# Patient Record
Sex: Male | Born: 1990 | Race: White | Hispanic: No | Marital: Single | Smoking: Current some day smoker
Health system: Southern US, Community
[De-identification: ages and names within clinical notes are randomized; demographics above are authoritative.]

## PROBLEM LIST (undated history)

## (undated) DIAGNOSIS — F909 Attention-deficit hyperactivity disorder, unspecified type: Secondary | ICD-10-CM

## (undated) HISTORY — DX: Attention-deficit hyperactivity disorder, unspecified type: F90.9

---
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
## 2008-05-22 IMAGING — CT CT HEAD W/O CM
3 of 8 series · 15 of 47 positions shown, 18 images · IV contrast (agent unspecified)
Comparison: None

CLINICAL DATA: Hit in head with a baseball. Left face and neck pain
TECHNIQUE: 5mm collimated images were obtained from the base of the skull
through the vertex according to standard protocol without contrast.

HEAD CT WITHOUT CONTRAST:
TECHNIQUE: Multidetector CT imaging of the cervical spine was performed. 
Sagittal and coronal plane reformatted images were reconstructed from the axial
CT data, and were also reviewed.
TECHNIQUE: Axial and coronal plane CT imaging of the maxillofacial structures
was performed including the facial bones, paranasal sinuses, and orbits.  No
intravenous contrast was administered.

[Series 9: orbit 1.0 h30s · axial · 0.31mm/px · z∈[-231,-88]mm · 9 of 169 slices shown, 12 images]
[im 13/169  brain]
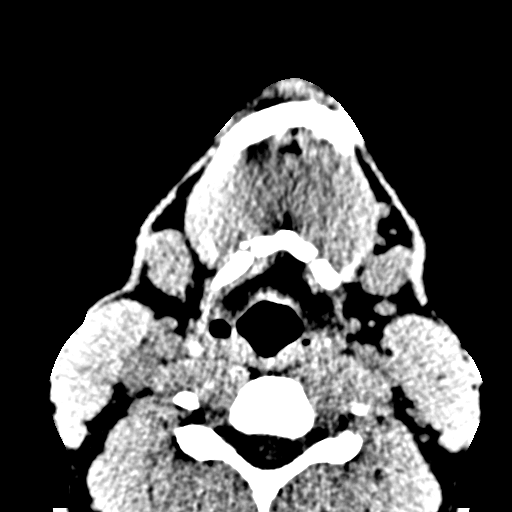
[im 13/169  bone]
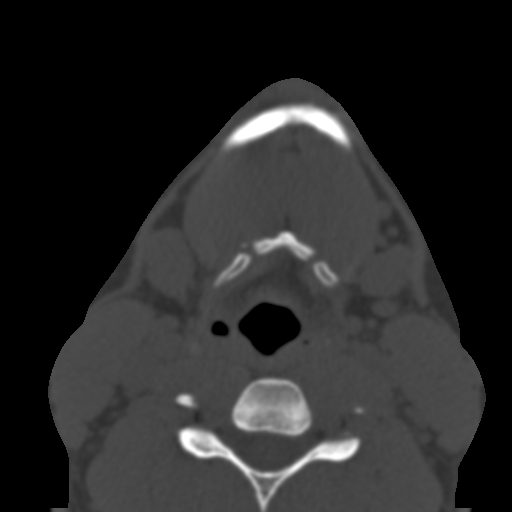
[im 39/169  brain]
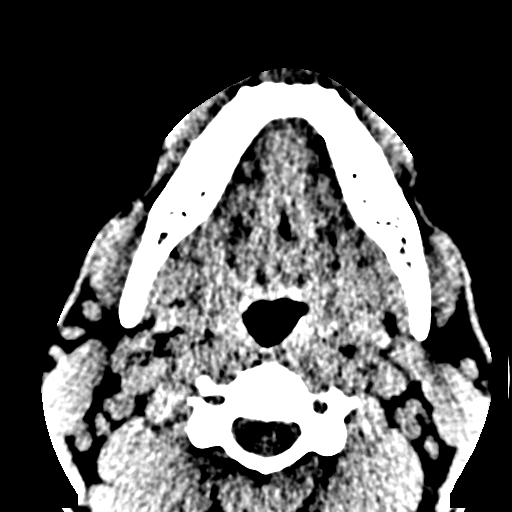
[im 52/169  brain]
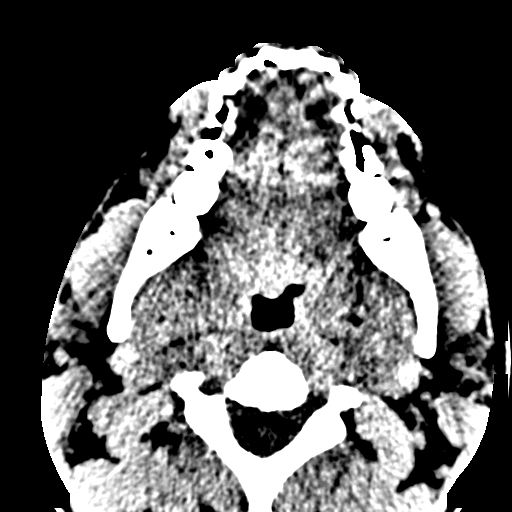
[im 65/169  brain]
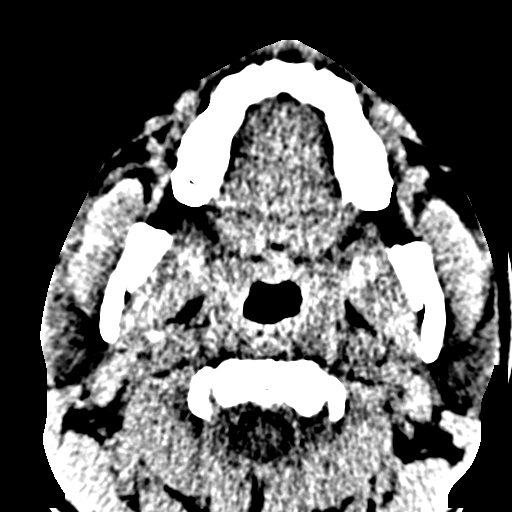
[im 91/169  brain]
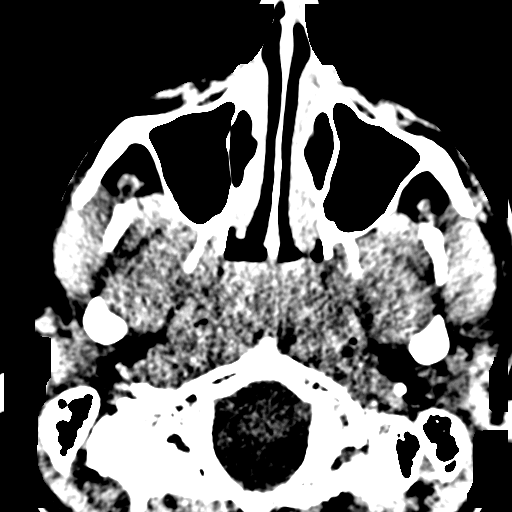
[im 91/169  bone]
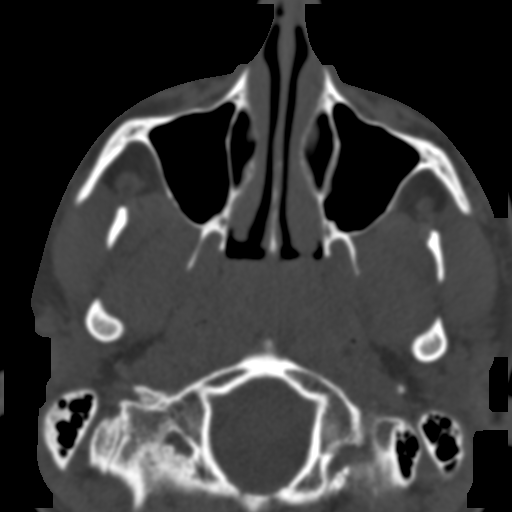
[im 104/169  brain]
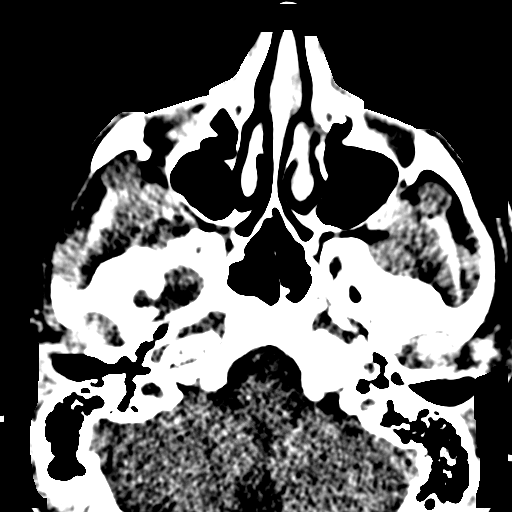
[im 117/169  brain]
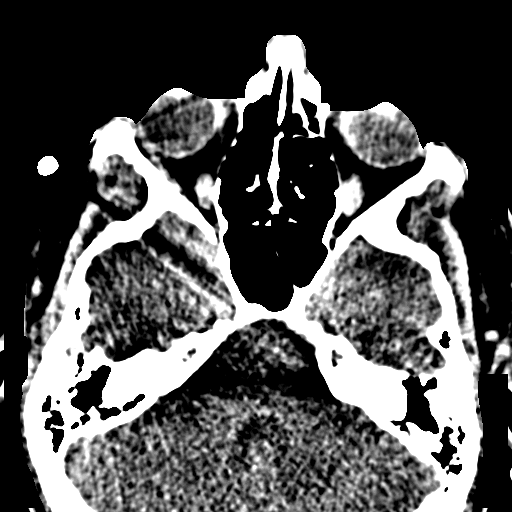
[im 143/169  brain]
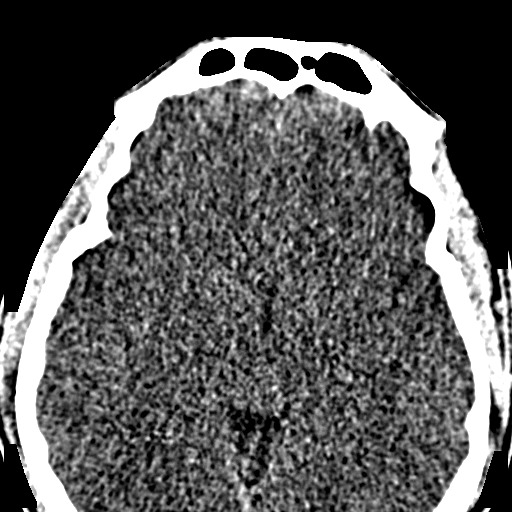
[im 156/169  brain]
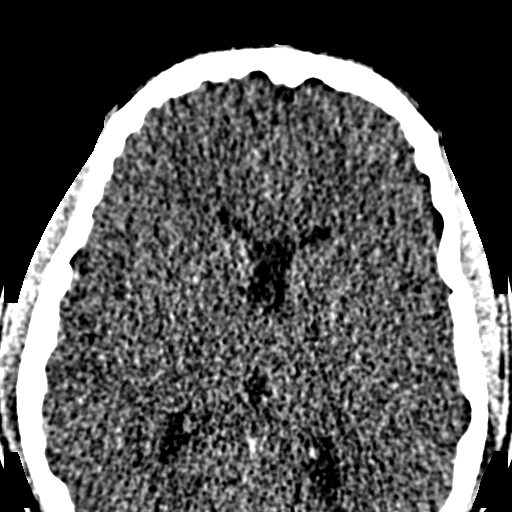
[im 156/169  bone]
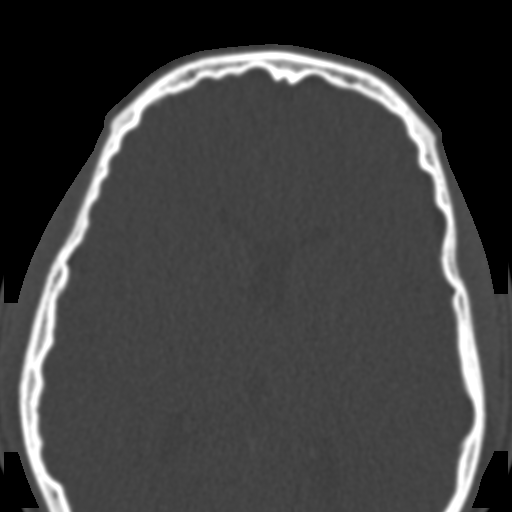

[Series 605: <mpr range(2)>coronal orbit soft ti · coronal · 0.33mm/px · 3 of 48 slices shown]
[im 12/48  brain]
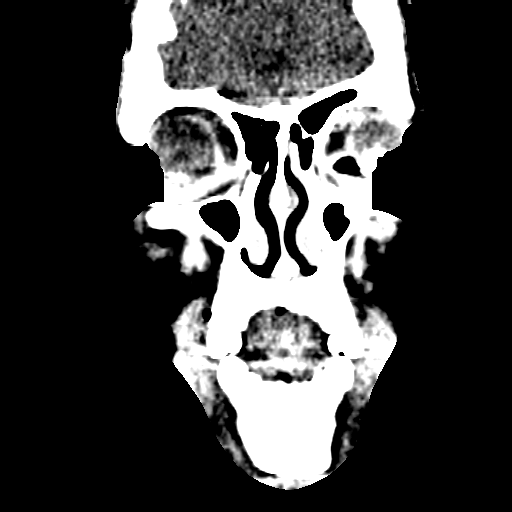
[im 24/48  brain]
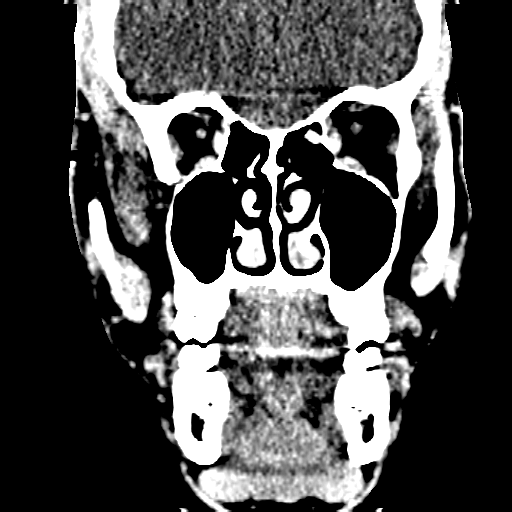
[im 36/48  brain]
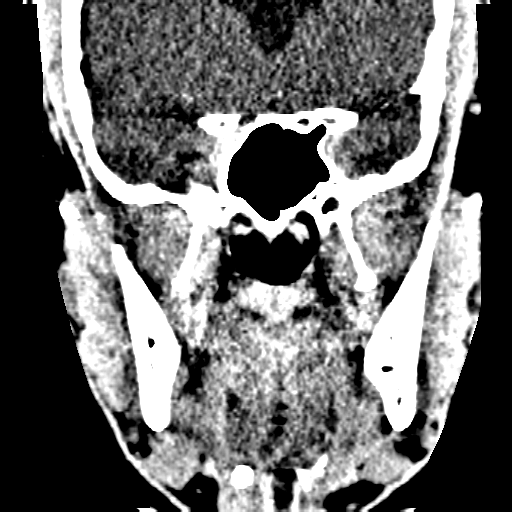

[Series 606: <mpr range(3)saggital · sagittal · 0.33mm/px · 3 of 69 slices shown]
[im 18/69  brain]
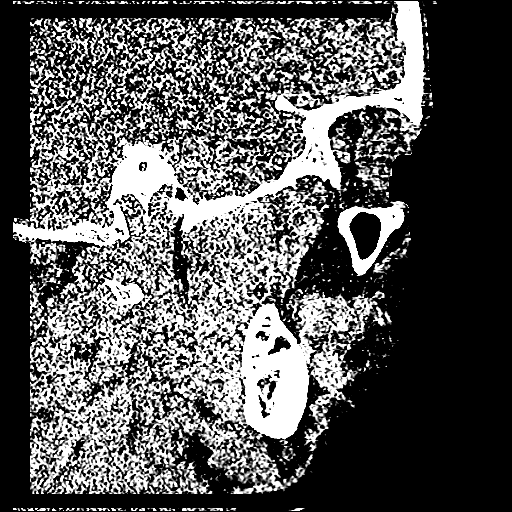
[im 35/69  brain]
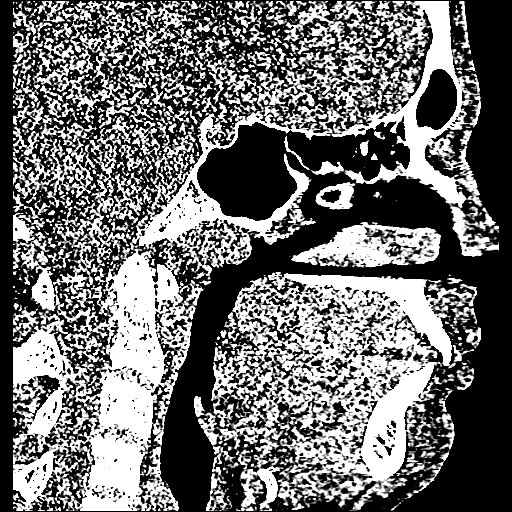
[im 52/69  brain]
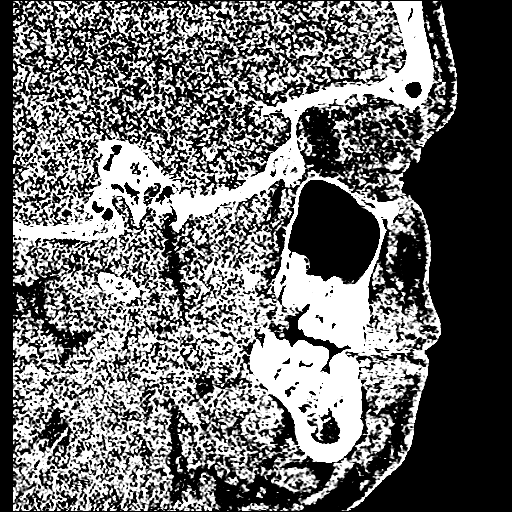

[15 of 47 positions shown; findings below may reference images not displayed]

FINDINGS: There is no evidence for acute hemorrhage, hydrocephalus,
mass/mass-effect or abnormal extra-axial fluid collection.  No definite CT
evidence for acute ischemia. Bone windows show no evidence for skull fracture.
The visualized paranasal sinuses are clear.
IMPRESSION: No acute intracranial abnormality. 

CERVICAL SPINE CT WITHOUT CONTRAST:
FINDINGS: Imaging was obtained from the skullbase to the T-[DATE] interspace. There
is straightening of the normal cervical lordosis. No evidence for acute fracture
or subluxation. Intervertebral disc spaces are preserved throughout. Facets are
well aligned bilaterally. There is no evidence for prevertebral soft tissue
swelling.
IMPRESSION: No evidence for acute fracture or subluxation. No static signs of instability.

Loss of normal cervical lordosis. This may be related to patient positioning,
muscle spasm, or soft tissue injury.

MAXILLOFACIAL CT WITHOUT CONTRAST:
FINDINGS: There is no evidence for an acute facial bone fracture. Specifically,
the mandible, zygomatic arches, nasal bones, paranasal sinuses, and temporal
bones are intact. The inferior and medial orbital walls are intact bilaterally.
The temporomandibular joints are located. No fluid is identified in the mastoid
air cells or paranasal sinuses to suggest hemorrhage.
IMPRESSION: No acute facial bone fracture.

## 2018-01-30 DIAGNOSIS — M545 Low back pain: Secondary | ICD-10-CM | POA: Diagnosis not present

## 2018-01-30 DIAGNOSIS — R509 Fever, unspecified: Secondary | ICD-10-CM | POA: Diagnosis not present

## 2018-03-15 DIAGNOSIS — F909 Attention-deficit hyperactivity disorder, unspecified type: Secondary | ICD-10-CM | POA: Diagnosis not present

## 2018-03-15 DIAGNOSIS — Z79899 Other long term (current) drug therapy: Secondary | ICD-10-CM | POA: Diagnosis not present

## 2018-04-18 DIAGNOSIS — Z79899 Other long term (current) drug therapy: Secondary | ICD-10-CM | POA: Diagnosis not present

## 2018-04-18 DIAGNOSIS — R946 Abnormal results of thyroid function studies: Secondary | ICD-10-CM | POA: Diagnosis not present

## 2018-04-18 DIAGNOSIS — F909 Attention-deficit hyperactivity disorder, unspecified type: Secondary | ICD-10-CM | POA: Diagnosis not present

## 2018-07-10 DIAGNOSIS — R945 Abnormal results of liver function studies: Secondary | ICD-10-CM | POA: Diagnosis not present

## 2018-07-10 DIAGNOSIS — Z6841 Body Mass Index (BMI) 40.0 and over, adult: Secondary | ICD-10-CM | POA: Diagnosis not present

## 2018-10-10 DIAGNOSIS — H66001 Acute suppurative otitis media without spontaneous rupture of ear drum, right ear: Secondary | ICD-10-CM | POA: Diagnosis not present

## 2020-11-30 DIAGNOSIS — H66006 Acute suppurative otitis media without spontaneous rupture of ear drum, recurrent, bilateral: Secondary | ICD-10-CM | POA: Diagnosis not present

## 2022-07-03 ENCOUNTER — Encounter (HOSPITAL_BASED_OUTPATIENT_CLINIC_OR_DEPARTMENT_OTHER): Payer: Self-pay | Admitting: Emergency Medicine

## 2022-07-03 ENCOUNTER — Other Ambulatory Visit: Payer: Self-pay

## 2022-07-03 DIAGNOSIS — L03115 Cellulitis of right lower limb: Secondary | ICD-10-CM | POA: Insufficient documentation

## 2022-07-03 NOTE — ED Triage Notes (Signed)
Right leg area redness, tender  Marked by patient Started Sunday morning.

## 2022-07-04 ENCOUNTER — Emergency Department (HOSPITAL_BASED_OUTPATIENT_CLINIC_OR_DEPARTMENT_OTHER)
Admission: EM | Admit: 2022-07-04 | Discharge: 2022-07-04 | Disposition: A | Discharge status: Home / Self Care | Payer: Self-pay | Attending: Emergency Medicine | Admitting: Emergency Medicine

## 2022-07-04 DIAGNOSIS — L03115 Cellulitis of right lower limb: Secondary | ICD-10-CM

## 2022-07-04 MED ORDER — DOXYCYCLINE HYCLATE 100 MG PO CAPS: 100.0000 mg | ORAL_CAPSULE | Freq: Two times a day (BID) | ORAL | 0 refills | Status: DC

## 2022-07-04 MED ORDER — DOXYCYCLINE HYCLATE 100 MG PO TABS
100.0000 mg | ORAL_TABLET | Freq: Once | ORAL | Status: AC
  Administered 2022-07-04: 100 mg via ORAL
  Filled 2022-07-04: qty 1

## 2022-07-04 NOTE — ED Provider Notes (Signed)
   [facility] EMERGENCY DEPT  Provider Note  CSN: [id number] Arrival date & time: 07/03/22 2303  History Chief Complaint  Patient presents with   Leg Pain    Brendan Peterson is a 31 y.o. male with no PMH reports increased pain, redness and swelling to his R lower leg for the last 24-36 hours. He marked the edge of the redness around 5pm and noticed a few hours later it had spread. No fevers. No known injury.    Home Medications Prior to Admission medications   Medication Sig Start Date End Date Taking? Authorizing Provider  doxycycline (VIBRAMYCIN) 100 MG capsule Take 1 capsule (100 mg total) by mouth 2 (two) times daily. 07/04/22  Yes Pollyann Savoy, MD     Allergies    Patient has no known allergies.   Review of Systems   Review of Systems Please see HPI for pertinent positives and negatives  Physical Exam BP 136/76 (BP Location: Right Arm)   Pulse 82   Temp 98.4 F (36.9 C)   Resp 19   SpO2 98%   Physical Exam Vitals and nursing note reviewed.  HENT:     Head: Normocephalic.     Nose: Nose normal.  Eyes:     Extraocular Movements: Extraocular movements intact.  Pulmonary:     Effort: Pulmonary effort is normal.  Musculoskeletal:        General: Normal range of motion.     Cervical back: Neck supple.     Comments: Erythema, warmth and induration to R lower leg, extending from ankle to mid-shin. No fluctuance  Skin:    Findings: No rash (on exposed skin).  Neurological:     Mental Status: He is alert and oriented to person, place, and time.  Psychiatric:        Mood and Affect: Mood normal.     ED Results / Procedures / Treatments   EKG None  Procedures Procedures  Medications Ordered in the ED Medications  doxycycline (VIBRA-TABS) tablet 100 mg (has no administration in time range)    Initial Impression and Plan  Patient here with cellulitis of RLE. No fluctuance. No crepitus to suggest necrotic infection. Will begin Abx,  erythema margin marked. Recommend 48 follow up for wound check. RTED sooner for any worsening pain, swelling, fever or other concerns.   ED Course       MDM Rules/Calculators/A&P Medical Decision Making Problems Addressed: Cellulitis of right lower extremity: acute illness or injury  Risk Prescription drug management.    Final Clinical Impression(s) / ED Diagnoses Final diagnoses:  Cellulitis of right lower extremity    Rx / DC Orders ED Discharge Orders          Ordered    doxycycline (VIBRAMYCIN) 100 MG capsule  2 times daily        07/04/22 0221             Pollyann Savoy, MD 07/04/22 0221

## 2024-04-20 NOTE — Patient Instructions (Incomplete)

## 2024-04-20 NOTE — Progress Notes (Unsigned)
 New Patient Visit  Subjective:     Patient ID: Brendan Peterson, male    DOB: [birth year], 33 y.o.   MRN: [id number]  No chief complaint on file.   HPI  Discussed the use of AI scribe software for clinical note transcription with the patient, who gave verbal consent to proceed.  History of Present Illness Brendan Peterson is a 33 year old male who presents for evaluation and management of hyperglycemia.  Hyperglycemia - Elevated blood glucose levels up to 360 mg/dL - Blood glucose decreased to 130-140 mg/dL after a five-mile hike and taking glyburide - Blood glucose increased to the upper 200s the following day despite dietary changes - Not currently taking blood glucose medication - Fasted prior to the visit  Headache - Headaches occur once or twice per week - Managed with ibuprofen - Attributed to work stress and dietary habits  Snoring and weight loss - Occasional snoring - Significant weight loss occurred five years ago after the birth of his son, which improved snoring - No history of sleep study  Tobacco and alcohol use - Current tobacco use with pouches - Attempting to quit tobacco use, motivated by young children - Alcohol consumption approximately once per month  Attention deficit hyperactivity disorder (adhd) symptoms - Diagnosed with ADHD in sixth grade - Intermittent use of Concerta after college - Considering resuming medication for work, but no urgent need     ROS Per HPI  Outpatient Encounter Medications as of 04/21/2024  Medication Sig   metFORMIN  (GLUCOPHAGE ) 500 MG tablet Take 1 tablet (500 mg total) by mouth 2 (two) times daily with a meal.   [DISCONTINUED] doxycycline  (VIBRAMYCIN ) 100 MG capsule Take 1 capsule (100 mg total) by mouth 2 (two) times daily. (Patient not taking: Reported on 04/21/2024)   No facility-administered encounter medications on file as of 04/21/2024.    Past Medical History:  Diagnosis Date   ADHD     History reviewed. No  pertinent surgical history.  History reviewed. No pertinent family history.  Social History   Socioeconomic History   Marital status: Single    Spouse name: Not on file   Number of children: Not on file   Years of education: Not on file   Highest education level: Not on file  Occupational History   Not on file  Tobacco Use   Smoking status: Some Days    Types: Cigarettes   Smokeless tobacco: Never  Substance and Sexual Activity   Alcohol use: Not on file   Drug use: Not on file   Sexual activity: Not on file  Other Topics Concern   Not on file  Social History Narrative   Not on file   Social Drivers of Health   Financial Resource Strain: Not on file  Food Insecurity: Not on file  Transportation Needs: Not on file  Physical Activity: Not on file  Stress: Not on file  Social Connections: Not on file  Intimate Partner Violence: Not on file       Objective:    BP 128/80 (BP Location: Left Arm, Patient Position: Sitting, Cuff Size: Large)   Pulse 80   Temp 98.7 F (37.1 C) (Temporal)   Ht 5' 11 (1.803 m)   Wt (!) 349 lb (158.3 kg)   SpO2 93%   BMI 48.68 kg/m    Physical Exam Vitals and nursing note reviewed.  Constitutional:      General: He is not in acute distress.    Appearance:  Normal appearance. He is obese.  HENT:     Head: Normocephalic and atraumatic.     Right Ear: External ear normal.     Left Ear: External ear normal.     Nose: Nose normal.     Mouth/Throat:     Mouth: Mucous membranes are moist.     Pharynx: Oropharynx is clear.  Eyes:     Extraocular Movements: Extraocular movements intact.  Cardiovascular:     Rate and Rhythm: Normal rate and regular rhythm.     Pulses: Normal pulses.     Heart sounds: Normal heart sounds.  Pulmonary:     Effort: Pulmonary effort is normal. No respiratory distress.     Breath sounds: Normal breath sounds. No wheezing, rhonchi or rales.  Musculoskeletal:        General: Normal range of motion.      Cervical back: Normal range of motion.     Right lower leg: No edema.     Left lower leg: No edema.  Lymphadenopathy:     Cervical: No cervical adenopathy.  Skin:    General: Skin is warm and dry.  Neurological:     General: No focal deficit present.     Mental Status: He is alert and oriented to person, place, and time.  Psychiatric:        Mood and Affect: Mood normal.        Behavior: Behavior normal.     No results found for any visits on 04/21/24.      Assessment & Plan:   Assessment and Plan Assessment & Plan Hyperglycemia Elevated blood glucose levels in the 300s. No prior glycemic management. Discussed sleep apnea's impact on glycemic control. - Order labs: liver function, renal function, glucose, sodium, potassium, CBC, thyroid function, lipid profile. - Initiate metformin  500 mg twice daily with meals, starting with dinner and bedtime. - Educate on metformin  side effects, including diarrhea. - Advise daily fasting blood glucose monitoring. - Provide dietary guidance: low carbohydrates, low sugars, higher protein, higher fat. - Refer to American Diabetes Association and Glucose [employer]dess for dietary information.  Morbid Obesity, BMI 48 Current weight gain. Discussed possible sleep apnea's impact on weight management. - Encourage physical activity: walking and biking. - Consider addressing sleep apnea if weight does not improve.  Snoring Possible obstructive sleep apnea Reports of snoring and improved symptoms with weight loss. No prior sleep study. - Consider sleep study if symptoms persist or do not improve with weight management and glycemic control.  Elevated BP Reading w/o HTN Slightly elevated blood pressure. Family history of cardiovascular events. - Reassess blood pressure management in future visits.  Nicotine dependence, smokeless tobacco Using smokeless tobacco pouches, motivated to quit. - Encourage cessation efforts. - Support use of Zins as a  cessation step.  Attention-deficit hyperactivity disorder, predominantly inattentive type Diagnosed in sixth grade, previously on Concerta. Discussed stimulant use risks with elevated blood pressure. - Prioritize glycemic control. - Reevaluate ADHD medication need in future visits.  Immunization due Discussed flu vaccination. - Administer flu shot during visit.  Encounter screening for cardiovascular disorders - Lipids today     Orders Placed This Encounter  Procedures   Flu vaccine trivalent PF, 6mos and older(Flulaval,Afluria,Fluarix,Fluzone)   CBC with Differential/Platelet    Release to patient:   Immediate [1]   Comprehensive metabolic panel with GFR    Release to patient:   Immediate [1]   Hemoglobin A1c   Lipid panel   Microalbumin / creatinine urine ratio  Release to patient:   Immediate   TSH   Vitamin B12   VITAMIN D  25 Hydroxy (Vit-D Deficiency, Fractures)     Meds ordered this encounter  Medications   metFORMIN  (GLUCOPHAGE ) 500 MG tablet    Sig: Take 1 tablet (500 mg total) by mouth 2 (two) times daily with a meal.    Dispense:  60 tablet    Refill:  1    Return in about 4 weeks (around 05/19/2024) for blood sugars, meds.  Corean LITTIE Ku, FNP

## 2024-04-21 ENCOUNTER — Ambulatory Visit (INDEPENDENT_AMBULATORY_CARE_PROVIDER_SITE_OTHER): Payer: Self-pay | Admitting: Family Medicine

## 2024-04-21 ENCOUNTER — Encounter: Payer: Self-pay | Admitting: Family Medicine

## 2024-04-21 VITALS — BP 128/80 | HR 80 | Temp 98.7°F | Ht 71.0 in | Wt 349.0 lb

## 2024-04-21 DIAGNOSIS — Z23 Encounter for immunization: Secondary | ICD-10-CM | POA: Diagnosis not present

## 2024-04-21 DIAGNOSIS — Z3009 Encounter for other general counseling and advice on contraception: Secondary | ICD-10-CM

## 2024-04-21 DIAGNOSIS — I10 Essential (primary) hypertension: Secondary | ICD-10-CM | POA: Diagnosis not present

## 2024-04-21 DIAGNOSIS — Z720 Tobacco use: Secondary | ICD-10-CM | POA: Diagnosis not present

## 2024-04-21 DIAGNOSIS — R739 Hyperglycemia, unspecified: Secondary | ICD-10-CM | POA: Insufficient documentation

## 2024-04-21 DIAGNOSIS — Z136 Encounter for screening for cardiovascular disorders: Secondary | ICD-10-CM | POA: Diagnosis not present

## 2024-04-21 DIAGNOSIS — Z6841 Body Mass Index (BMI) 40.0 and over, adult: Secondary | ICD-10-CM | POA: Insufficient documentation

## 2024-04-21 DIAGNOSIS — F902 Attention-deficit hyperactivity disorder, combined type: Secondary | ICD-10-CM | POA: Insufficient documentation

## 2024-04-21 DIAGNOSIS — R0683 Snoring: Secondary | ICD-10-CM | POA: Diagnosis not present

## 2024-04-21 LAB — MICROALBUMIN / CREATININE URINE RATIO
Creatinine,U: 181.8 mg/dL
Microalb Creat Ratio: 60.8 mg/g — ABNORMAL HIGH (ref 0.0–30.0)
Microalb, Ur: 11.1 mg/dL — ABNORMAL HIGH (ref 0.0–1.9)

## 2024-04-21 LAB — CBC WITH DIFFERENTIAL/PLATELET
Basophils Absolute: 0 K/uL (ref 0.0–0.1)
Basophils Relative: 0.1 % (ref 0.0–3.0)
Eosinophils Absolute: 0.3 K/uL (ref 0.0–0.7)
Eosinophils Relative: 2 % (ref 0.0–5.0)
HCT: 47.7 % (ref 39.0–52.0)
Hemoglobin: 15.8 g/dL (ref 13.0–17.0)
Lymphocytes Relative: 8.5 % — ABNORMAL LOW (ref 12.0–46.0)
Lymphs Abs: 1.1 K/uL (ref 0.7–4.0)
MCHC: 33.2 g/dL (ref 30.0–36.0)
MCV: 82.8 fl (ref 78.0–100.0)
Monocytes Absolute: 0.8 K/uL (ref 0.1–1.0)
Monocytes Relative: 6.2 % (ref 3.0–12.0)
Neutro Abs: 10.8 K/uL — ABNORMAL HIGH (ref 1.4–7.7)
Neutrophils Relative %: 83.2 % — ABNORMAL HIGH (ref 43.0–77.0)
Platelets: 215 K/uL (ref 150.0–400.0)
RBC: 5.75 Mil/uL (ref 4.22–5.81)
RDW: 13.3 % (ref 11.5–15.5)
WBC: 13 K/uL — ABNORMAL HIGH (ref 4.0–10.5)

## 2024-04-21 LAB — VITAMIN B12: Vitamin B-12: 338 pg/mL (ref 211–911)

## 2024-04-21 LAB — COMPREHENSIVE METABOLIC PANEL WITH GFR
ALT: 44 U/L (ref 0–53)
AST: 24 U/L (ref 0–37)
Albumin: 4.6 g/dL (ref 3.5–5.2)
Alkaline Phosphatase: 67 U/L (ref 39–117)
BUN: 17 mg/dL (ref 6–23)
CO2: 27 meq/L (ref 19–32)
Calcium: 9.5 mg/dL (ref 8.4–10.5)
Chloride: 99 meq/L (ref 96–112)
Creatinine, Ser: 0.94 mg/dL (ref 0.40–1.50)
GFR: 105.4 mL/min (ref >60.00)
Glucose, Bld: 238 mg/dL — ABNORMAL HIGH (ref 70–99)
Potassium: 4.5 meq/L (ref 3.5–5.1)
Sodium: 134 meq/L — ABNORMAL LOW (ref 135–145)
Total Bilirubin: 0.5 mg/dL (ref 0.2–1.2)
Total Protein: 7 g/dL (ref 6.0–8.3)

## 2024-04-21 LAB — LIPID PANEL
Cholesterol: 173 mg/dL (ref 0–200)
HDL: 45.3 mg/dL (ref >39.00)
LDL Cholesterol: 103 mg/dL — ABNORMAL HIGH (ref 0–99)
NonHDL: 128.1
Total CHOL/HDL Ratio: 4
Triglycerides: 125 mg/dL (ref 0.0–149.0)
VLDL: 25 mg/dL (ref 0.0–40.0)

## 2024-04-21 LAB — HEMOGLOBIN A1C: Hgb A1c MFr Bld: 9.3 % — ABNORMAL HIGH (ref 4.6–6.5)

## 2024-04-21 LAB — TSH: TSH: 1.89 u[IU]/mL (ref 0.35–5.50)

## 2024-04-21 LAB — VITAMIN D 25 HYDROXY (VIT D DEFICIENCY, FRACTURES): VITD: 21.32 ng/mL — ABNORMAL LOW (ref 30.00–100.00)

## 2024-04-21 MED ORDER — METFORMIN HCL 500 MG PO TABS: 500.0000 mg | ORAL_TABLET | Freq: Two times a day (BID) | ORAL | 1 refills | Status: DC

## 2024-04-21 NOTE — Telephone Encounter (Signed)
 As you know, we did not see this patient today.  Someone else did.

## 2024-04-25 ENCOUNTER — Ambulatory Visit: Payer: Self-pay | Admitting: Family Medicine

## 2024-04-25 DIAGNOSIS — E559 Vitamin D deficiency, unspecified: Secondary | ICD-10-CM

## 2024-04-25 MED ORDER — VITAMIN D (ERGOCALCIFEROL) 1.25 MG (50000 UNIT) PO CAPS: 50000.0000 [IU] | ORAL_CAPSULE | ORAL | 0 refills | Status: DC

## 2024-05-13 ENCOUNTER — Other Ambulatory Visit: Payer: Self-pay | Admitting: Family Medicine

## 2024-05-13 DIAGNOSIS — Z6841 Body Mass Index (BMI) 40.0 and over, adult: Secondary | ICD-10-CM

## 2024-05-13 DIAGNOSIS — R739 Hyperglycemia, unspecified: Secondary | ICD-10-CM

## 2024-05-14 ENCOUNTER — Encounter: Payer: Self-pay | Admitting: Family Medicine

## 2024-05-14 ENCOUNTER — Telehealth: Admitting: Family Medicine

## 2024-05-14 DIAGNOSIS — J069 Acute upper respiratory infection, unspecified: Secondary | ICD-10-CM | POA: Diagnosis not present

## 2024-05-14 DIAGNOSIS — H6506 Acute serous otitis media, recurrent, bilateral: Secondary | ICD-10-CM | POA: Diagnosis not present

## 2024-05-14 MED ORDER — PSEUDOEPH-BROMPHEN-DM 30-2-10 MG/5ML PO SYRP: 5.0000 mL | ORAL_SOLUTION | Freq: Four times a day (QID) | ORAL | 0 refills | Status: DC | PRN

## 2024-05-14 MED ORDER — NEOMYCIN-POLYMYXIN-HC 3.5-10000-1 OT SOLN: 3.0000 [drp] | Freq: Four times a day (QID) | OTIC | 0 refills | Status: DC

## 2024-05-14 MED ORDER — FLUTICASONE PROPIONATE 50 MCG/ACT NA SUSP: 2.0000 | Freq: Every day | NASAL | 0 refills | Status: DC

## 2024-05-14 MED ORDER — OFLOXACIN 0.3 % OT SOLN: 10.0000 [drp] | Freq: Every day | OTIC | 0 refills | Status: AC

## 2024-05-14 NOTE — Progress Notes (Signed)

## 2024-05-14 NOTE — Addendum Note (Signed)
 Addended byBETHA KINGSTON ROBES on: 05/14/2024 10:32 AM   Modules accepted: Orders

## 2024-05-14 NOTE — Addendum Note (Signed)
 Addended by: VIVIENNE DELON HERO on: 05/14/2024 09:45 AM   Modules accepted: Orders

## 2024-05-14 NOTE — Progress Notes (Signed)
 Ear drops are costly, I will send a different kind.

## 2024-05-19 ENCOUNTER — Telehealth: Admitting: Family Medicine

## 2024-05-19 ENCOUNTER — Ambulatory Visit: Admitting: Family Medicine

## 2024-05-19 DIAGNOSIS — H6506 Acute serous otitis media, recurrent, bilateral: Secondary | ICD-10-CM

## 2024-05-19 NOTE — Progress Notes (Signed)
  Because Mr. Brendan Peterson, I feel your condition warrants further evaluation and I recommend that you be seen in a face-to-face visit.  You need to be examined in person.    NOTE: There will be NO CHARGE for this E-Visit   If you are having a true medical emergency, please call 911.     For an urgent face to face visit, [facility] has multiple urgent care centers for your convenience.  Click the link below for the full list of locations and hours, walk-in wait times, appointment scheduling options and driving directions:  Urgent Care - [place], [place], [place], [place], [place], [place]       Your MyChart E-visit questionnaire answers were reviewed by a board certified advanced clinical practitioner to complete your personal care plan based on your specific symptoms.    Thank you for using e-Visits.

## 2024-05-22 NOTE — Progress Notes (Signed)
 Established Patient Office Visit  Subjective:     Patient ID: Brendan Peterson, male    DOB: 1990-08-23, 33 y.o.   MRN: [id number]  No chief complaint on file.   HPI  Discussed the use of AI scribe software for clinical note transcription with the patient, who gave verbal consent to proceed.  History of Present Illness Brendan Peterson is a 33 year old male with diabetes who presents with elevated blood sugar levels and an ear infection.  Hyperglycemia - Elevated blood glucose levels recently increased to 223 mg/dL, previously ranged from 160s to 170s - Monitors fasting blood sugar daily - Takes Metformin  for glycemic control - Recent increase in blood glucose coincided with onset of ear infection  Otalgia and otitis externa - Developed ear infection four days ago - Severe ear pain radiating down the jaw, onset a few days after initial ear discomfort - Prescribed ear drops and taking Augmentin for infection  Gastroenteritis - Recent episode of stomach bug lasting three days - Illness coincided with son's birthday, causing additional stress and disruption of routine  Lifestyle modifications - Making dietary changes including increased water intake and consumption of more whole grains - Reducing nicotine use to three milligrams of nicotine gum     ROS Per HPI      Objective:    BP 130/82 (BP Location: Left Arm, Patient Position: Sitting)   Pulse 69   Temp 98.5 F (36.9 C) (Temporal)   Ht 5' 11 (1.803 m)   Wt (!) 346 lb 3.2 oz (157 kg)   SpO2 95%   BMI 48.29 kg/m    Physical Exam Vitals and nursing note reviewed.  Constitutional:      General: He is not in acute distress.    Appearance: He is obese.     Comments: Appears fatigued  HENT:     Head: Normocephalic and atraumatic.     Right Ear: External ear normal. A middle ear effusion is present. Tympanic membrane is erythematous and bulging.     Left Ear: External ear normal. A middle ear effusion is present.  Tympanic membrane is erythematous and bulging.     Nose: Congestion present.     Mouth/Throat:     Mouth: Mucous membranes are moist.     Pharynx: Oropharynx is clear.     Comments: Oropharyngeal cobblestoning   Eyes:     Extraocular Movements: Extraocular movements intact.  Cardiovascular:     Rate and Rhythm: Normal rate and regular rhythm.     Pulses: Normal pulses.     Heart sounds: Normal heart sounds.  Pulmonary:     Effort: Pulmonary effort is normal. No respiratory distress.     Breath sounds: Normal breath sounds. No wheezing, rhonchi or rales.  Musculoskeletal:        General: Normal range of motion.     Cervical back: Normal range of motion.     Right lower leg: No edema.     Left lower leg: No edema.  Lymphadenopathy:     Cervical: Cervical adenopathy present.  Skin:    General: Skin is warm and dry.  Neurological:     General: No focal deficit present.     Mental Status: He is alert and oriented to person, place, and time.  Psychiatric:        Mood and Affect: Mood normal.        Behavior: Behavior normal.     No results found for any visits on  05/23/24.  The ASCVD Risk score (Arnett DK, et al., 2019) failed to calculate for the following reasons:   The 2019 ASCVD risk score is only valid for ages 94 to 38  BP Readings from Last 3 Encounters:  05/27/24 132/88  05/23/24 130/82  04/21/24 128/80   Wt Readings from Last 3 Encounters:  05/27/24 (!) 346 lb (156.9 kg)  05/23/24 (!) 346 lb 3.2 oz (157 kg)  04/21/24 (!) 349 lb (158.3 kg)      Last CBC Lab Results  Component Value Date   WBC 13.0 (H) 04/21/2024   HGB 15.8 04/21/2024   HCT 47.7 04/21/2024   MCV 82.8 04/21/2024   RDW 13.3 04/21/2024   PLT 215.0 04/21/2024   Last metabolic panel Lab Results  Component Value Date   GLUCOSE 238 (H) 04/21/2024   NA 134 (L) 04/21/2024   K 4.5 04/21/2024   CL 99 04/21/2024   CO2 27 04/21/2024   BUN 17 04/21/2024   CREATININE 0.94 04/21/2024   GFR  105.40 04/21/2024   CALCIUM 9.5 04/21/2024   PROT 7.0 04/21/2024   ALBUMIN 4.6 04/21/2024   BILITOT 0.5 04/21/2024   ALKPHOS 67 04/21/2024   AST 24 04/21/2024   ALT 44 04/21/2024   Last lipids Lab Results  Component Value Date   CHOL 173 04/21/2024   HDL 45.30 04/21/2024   LDLCALC 103 (H) 04/21/2024   TRIG 125.0 04/21/2024   CHOLHDL 4 04/21/2024   Last hemoglobin A1c Lab Results  Component Value Date   HGBA1C 9.3 (H) 04/21/2024   Last thyroid functions Lab Results  Component Value Date   TSH 1.89 04/21/2024   Last vitamin D  Lab Results  Component Value Date   VD25OH 21.32 (L) 04/21/2024   Last vitamin B12 and Folate Lab Results  Component Value Date   VITAMINB12 338 04/21/2024         Assessment & Plan:   Assessment and Plan Assessment & Plan Bilateral otitis media with effusion Ear infection with ongoing symptoms after initial treatment. Augmentin prescribed, Rocephin planned for stronger intervention. Discussed potential need for consecutive injections if no improvement. - Administer Rocephin injection. - Continue Augmentin. - Re-evaluate on Monday if symptoms persist.  Type 2 diabetes with hyperglycemia without long-term use of insulin Blood sugar elevated, likely due to ear infection. Previously improving. Emphasized long-term trends over daily fluctuations. - Continue Metformin  500 mg oral bid. - Monitor blood sugar levels daily. - Recheck A1c after Christmas.  Nicotine dependence, smokeless tobacco Progress in reducing nicotine use. Encouraged continued reduction efforts. - Continue efforts to reduce nicotine use.  General Health Maintenance Supports lifestyle changes including increased water intake and healthier diet. - Continue healthy diet and exercise regimen.     No orders of the defined types were placed in this encounter.    Meds ordered this encounter  Medications   cefTRIAXone (ROCEPHIN) injection 1 g    Return for after  07/21/24 for OV, meds.  Corean LITTIE Ku, FNP

## 2024-05-23 ENCOUNTER — Ambulatory Visit (INDEPENDENT_AMBULATORY_CARE_PROVIDER_SITE_OTHER): Admitting: Family Medicine

## 2024-05-23 ENCOUNTER — Encounter: Payer: Self-pay | Admitting: Family Medicine

## 2024-05-23 VITALS — BP 130/82 | HR 69 | Temp 98.5°F | Ht 71.0 in | Wt 346.2 lb

## 2024-05-23 DIAGNOSIS — H6593 Unspecified nonsuppurative otitis media, bilateral: Secondary | ICD-10-CM

## 2024-05-23 DIAGNOSIS — Z7984 Long term (current) use of oral hypoglycemic drugs: Secondary | ICD-10-CM

## 2024-05-23 DIAGNOSIS — Z720 Tobacco use: Secondary | ICD-10-CM

## 2024-05-23 DIAGNOSIS — I10 Essential (primary) hypertension: Secondary | ICD-10-CM

## 2024-05-23 DIAGNOSIS — E1165 Type 2 diabetes mellitus with hyperglycemia: Secondary | ICD-10-CM

## 2024-05-23 DIAGNOSIS — F902 Attention-deficit hyperactivity disorder, combined type: Secondary | ICD-10-CM

## 2024-05-23 DIAGNOSIS — R739 Hyperglycemia, unspecified: Secondary | ICD-10-CM

## 2024-05-23 MED ORDER — CEFTRIAXONE SODIUM 1 G IJ SOLR
1.0000 g | Freq: Once | INTRAMUSCULAR | Status: AC
  Administered 2024-05-23: 1 g via INTRAMUSCULAR

## 2024-05-23 NOTE — Patient Instructions (Addendum)
 You have received an antibiotic injection in the office today.   Continue monitoring blood sugars and taking metformin   Follow up with me right after Christmas for labs and med follow up  Follow up with me Monday if you ears are not feeling much better by then.

## 2024-05-26 ENCOUNTER — Encounter: Payer: Self-pay | Admitting: Family Medicine

## 2024-05-26 NOTE — Telephone Encounter (Signed)
 Per PCP, wants to look at ear again today. Patient scheduled for tomorrow. No appts left for today

## 2024-05-26 NOTE — Progress Notes (Unsigned)
   Acute Office Visit  Subjective:     Patient ID: Brendan Peterson, male    DOB: 09-20-1990, 33 y.o.   MRN: [id number]  No chief complaint on file.   HPI  Discussed the use of AI scribe software for clinical note transcription with the patient, who gave verbal consent to proceed.  History of Present Illness      ROS Per HPI      Objective:    There were no vitals taken for this visit.   Physical Exam Vitals and nursing note reviewed.  Constitutional:      General: He is not in acute distress.    Appearance: Normal appearance.  HENT:     Head: Normocephalic and atraumatic.     Right Ear: External ear normal.     Left Ear: External ear normal.     Nose: Nose normal.     Mouth/Throat:     Mouth: Mucous membranes are moist.     Pharynx: Oropharynx is clear.  Eyes:     Extraocular Movements: Extraocular movements intact.  Cardiovascular:     Rate and Rhythm: Normal rate and regular rhythm.     Pulses: Normal pulses.     Heart sounds: Normal heart sounds.  Pulmonary:     Effort: Pulmonary effort is normal. No respiratory distress.     Breath sounds: Normal breath sounds. No wheezing, rhonchi or rales.  Musculoskeletal:        General: Normal range of motion.     Cervical back: Normal range of motion.     Right lower leg: No edema.     Left lower leg: No edema.  Lymphadenopathy:     Cervical: No cervical adenopathy.  Skin:    General: Skin is warm and dry.  Neurological:     General: No focal deficit present.     Mental Status: He is alert and oriented to person, place, and time.  Psychiatric:        Mood and Affect: Mood normal.        Behavior: Behavior normal.     No results found for any visits on 05/27/24.      Assessment & Plan:   Assessment and Plan Assessment & Plan      No orders of the defined types were placed in this encounter.    No orders of the defined types were placed in this encounter.   No follow-ups on  file.  Corean LITTIE Ku, FNP

## 2024-05-27 ENCOUNTER — Encounter: Payer: Self-pay | Admitting: Family Medicine

## 2024-05-27 ENCOUNTER — Ambulatory Visit (INDEPENDENT_AMBULATORY_CARE_PROVIDER_SITE_OTHER): Admitting: Family Medicine

## 2024-05-27 VITALS — BP 132/88 | HR 81 | Temp 98.0°F | Ht 71.0 in | Wt 346.0 lb

## 2024-05-27 DIAGNOSIS — H6593 Unspecified nonsuppurative otitis media, bilateral: Secondary | ICD-10-CM | POA: Diagnosis not present

## 2024-05-27 MED ORDER — CEFTRIAXONE SODIUM 1 G IJ SOLR
1.0000 g | Freq: Once | INTRAMUSCULAR | Status: AC
  Administered 2024-05-27: 1 g via INTRAMUSCULAR

## 2024-05-27 NOTE — Patient Instructions (Signed)
 Follow-up tomorrow for nurse visit for Rocephin injection.  Finish out the Augmentin course.  Follow-up with us  as needed

## 2024-05-28 ENCOUNTER — Ambulatory Visit

## 2024-05-28 DIAGNOSIS — H6593 Unspecified nonsuppurative otitis media, bilateral: Secondary | ICD-10-CM

## 2024-05-28 MED ORDER — CEFTRIAXONE SODIUM 1 G IJ SOLR
1.0000 g | Freq: Once | INTRAMUSCULAR | Status: AC
  Administered 2024-05-29: 1 g via INTRAMUSCULAR

## 2024-05-28 NOTE — Progress Notes (Addendum)
 Patient presents in office today for Rocephin injection. Tolerated well, will reach out to us  on Monday if ears are still bothering him.  Medical screening examination/treatment/procedure(s) were performed by non-physician practitioner and as supervising physician I was immediately available for consultation/collaboration.  I agree with above. Karlynn Noel, MD

## 2024-05-29 DIAGNOSIS — E1165 Type 2 diabetes mellitus with hyperglycemia: Secondary | ICD-10-CM | POA: Insufficient documentation

## 2024-05-29 DIAGNOSIS — H6593 Unspecified nonsuppurative otitis media, bilateral: Secondary | ICD-10-CM | POA: Diagnosis not present

## 2024-07-25 ENCOUNTER — Encounter: Payer: Self-pay | Admitting: Family Medicine

## 2024-07-28 ENCOUNTER — Encounter: Payer: Self-pay | Admitting: Family Medicine

## 2024-07-28 ENCOUNTER — Ambulatory Visit (INDEPENDENT_AMBULATORY_CARE_PROVIDER_SITE_OTHER): Admitting: Family Medicine

## 2024-07-28 VITALS — BP 124/80 | HR 77 | Temp 98.8°F | Wt 351.0 lb

## 2024-07-28 DIAGNOSIS — Z79899 Other long term (current) drug therapy: Secondary | ICD-10-CM

## 2024-07-28 DIAGNOSIS — I10 Essential (primary) hypertension: Secondary | ICD-10-CM | POA: Diagnosis not present

## 2024-07-28 DIAGNOSIS — H9201 Otalgia, right ear: Secondary | ICD-10-CM

## 2024-07-28 DIAGNOSIS — Z7984 Long term (current) use of oral hypoglycemic drugs: Secondary | ICD-10-CM

## 2024-07-28 DIAGNOSIS — H6591 Unspecified nonsuppurative otitis media, right ear: Secondary | ICD-10-CM

## 2024-07-28 DIAGNOSIS — E1165 Type 2 diabetes mellitus with hyperglycemia: Secondary | ICD-10-CM | POA: Diagnosis not present

## 2024-07-28 DIAGNOSIS — Z6841 Body Mass Index (BMI) 40.0 and over, adult: Secondary | ICD-10-CM

## 2024-07-28 DIAGNOSIS — H6691 Otitis media, unspecified, right ear: Secondary | ICD-10-CM | POA: Diagnosis not present

## 2024-07-28 DIAGNOSIS — F902 Attention-deficit hyperactivity disorder, combined type: Secondary | ICD-10-CM

## 2024-07-28 DIAGNOSIS — E559 Vitamin D deficiency, unspecified: Secondary | ICD-10-CM

## 2024-07-28 LAB — VITAMIN D 25 HYDROXY (VIT D DEFICIENCY, FRACTURES): VITD: 21.61 ng/mL — ABNORMAL LOW (ref 30.00–100.00)

## 2024-07-28 LAB — COMPREHENSIVE METABOLIC PANEL WITH GFR
ALT: 60 U/L — ABNORMAL HIGH (ref 3–53)
AST: 21 U/L (ref 5–37)
Albumin: 4.5 g/dL (ref 3.5–5.2)
Alkaline Phosphatase: 70 U/L (ref 39–117)
BUN: 10 mg/dL (ref 6–23)
CO2: 29 meq/L (ref 19–32)
Calcium: 9 mg/dL (ref 8.4–10.5)
Chloride: 98 meq/L (ref 96–112)
Creatinine, Ser: 0.92 mg/dL (ref 0.40–1.50)
GFR: 107.96 mL/min
Glucose, Bld: 282 mg/dL — ABNORMAL HIGH (ref 70–99)
Potassium: 4.4 meq/L (ref 3.5–5.1)
Sodium: 136 meq/L (ref 135–145)
Total Bilirubin: 0.5 mg/dL (ref 0.2–1.2)
Total Protein: 6.9 g/dL (ref 6.0–8.3)

## 2024-07-28 LAB — HEMOGLOBIN A1C: Hgb A1c MFr Bld: 10.3 % — ABNORMAL HIGH (ref 4.6–6.5)

## 2024-07-28 MED ORDER — AMOXICILLIN-POT CLAVULANATE 875-125 MG PO TABS: 1.0000 | ORAL_TABLET | Freq: Two times a day (BID) | ORAL | 0 refills | Status: AC

## 2024-07-28 MED ORDER — CEFTRIAXONE SODIUM 1 G IJ SOLR
1.0000 g | Freq: Once | INTRAMUSCULAR | Status: AC
  Administered 2024-07-28: 1 g via INTRAMUSCULAR

## 2024-07-28 MED ORDER — METFORMIN HCL 1000 MG PO TABS: 1000.0000 mg | ORAL_TABLET | Freq: Two times a day (BID) | ORAL | 3 refills | Status: AC

## 2024-07-28 NOTE — Patient Instructions (Addendum)
 Continue current medication regimen.   Follow up with specialists as scheduled.   We are checking labs today, will be in contact with any results that require further attention.  I have sent in a referral to ENT. They will be reaching out to get you scheduled.   You have received an antibiotic injection in the office today.   I have sent in Augmentin for you to take twice a day for 10 days. This medication can upset your stomach, so I tell everyone to take it with a meal.  Follow-up with me in 3 mos for medication management, sooner if needed.

## 2024-07-28 NOTE — Progress Notes (Unsigned)
 "  Established Patient Office Visit  Subjective:     Patient ID: Brendan Peterson, male    DOB: May 27, 1991, 33 y.o.   MRN: [id number]  Chief Complaint  Patient presents with   Follow-up    F/u ear aches in right ear    HPI  Discussed the use of AI scribe software for clinical note transcription with the patient, who gave verbal consent to proceed.  History of Present Illness Brendan Peterson is a 33 year old male with recurrent ear infections and diabetes who presents with ear pain and elevated blood sugars.  Otalgia and recurrent ear infections - Recurrent ear infections over the past six months - Current episode with severe ear pain rated 9-10/10 - Pain radiates to jaw and teeth - Symptoms sometimes resolve spontaneously after a few days - No recent upper respiratory illness, fever, allergy, or sinus symptoms - No antibiotic allergies  Hyperglycemia and diabetes management - Elevated blood sugars with fasting values higher than postprandial values - Morning fasting glucose approximately 240 mg/dL - Evening (0-89 PM) glucose readings 170-190 mg/dL - Home glucose readings generally in the high 100s to 200s - Takes metformin  500 mg twice daily without gastrointestinal side effects - A1c has improved from previously very high levels  Lifestyle and dietary habits - Practices intermittent fasting, eating between noon and 7:30 PM - Increased dietary lapses during the holidays - Recently joined a gym and increased walking for physical activity     ROS Per HPI      Objective:    BP 124/80 (BP Location: Right Arm, Patient Position: Sitting)   Pulse 77   Temp 98.8 F (37.1 C) (Temporal)   Wt (!) 351 lb (159.2 kg)   SpO2 96%   BMI 48.95 kg/m    Physical Exam Vitals and nursing note reviewed.  Constitutional:      General: Brendan Peterson is not in acute distress.    Appearance: Brendan Peterson is obese.     Comments: Appears fatigued   HENT:     Head: Normocephalic and atraumatic.     Right  Ear: External ear normal. A middle ear effusion is present. Tympanic membrane is erythematous and bulging.     Left Ear: External ear normal.     Nose: Nose normal.     Mouth/Throat:     Mouth: Mucous membranes are moist.     Pharynx: Oropharynx is clear.  Eyes:     Extraocular Movements: Extraocular movements intact.  Cardiovascular:     Rate and Rhythm: Normal rate and regular rhythm.     Pulses: Normal pulses.     Heart sounds: Normal heart sounds.  Pulmonary:     Effort: Pulmonary effort is normal. No respiratory distress.     Breath sounds: Normal breath sounds. No wheezing, rhonchi or rales.  Musculoskeletal:        General: Normal range of motion.     Cervical back: Normal range of motion.     Right lower leg: No edema.     Left lower leg: No edema.  Lymphadenopathy:     Cervical: No cervical adenopathy.  Skin:    General: Skin is warm and dry.  Neurological:     General: No focal deficit present.     Mental Status: Brendan Peterson is alert and oriented to person, place, and time.  Psychiatric:        Mood and Affect: Mood normal.        Behavior: Behavior normal.  Results for orders placed or performed in visit on 07/28/24  Comprehensive metabolic panel with GFR  Result Value Ref Range   Sodium 136 135 - 145 mEq/L   Potassium 4.4 3.5 - 5.1 mEq/L   Chloride 98 96 - 112 mEq/L   CO2 29 19 - 32 mEq/L   Glucose, Bld 282 (H) 70 - 99 mg/dL   BUN 10 6 - 23 mg/dL   Creatinine, Ser 9.07 0.40 - 1.50 mg/dL   Total Bilirubin 0.5 0.2 - 1.2 mg/dL   Alkaline Phosphatase 70 39 - 117 U/L   AST 21 5 - 37 U/L   ALT 60 (H) 3 - 53 U/L   Total Protein 6.9 6.0 - 8.3 g/dL   Albumin 4.5 3.5 - 5.2 g/dL   GFR 892.03 >39.99 mL/min   Calcium 9.0 8.4 - 10.5 mg/dL  Hemoglobin J8r  Result Value Ref Range   Hgb A1c MFr Bld 10.3 (H) 4.6 - 6.5 %  VITAMIN D  25 Hydroxy (Vit-D Deficiency, Fractures)  Result Value Ref Range   VITD 21.61 (L) 30.00 - 100.00 ng/mL     BP Readings from Last 3  Encounters:  07/28/24 124/80  05/27/24 132/88  05/23/24 130/82   Wt Readings from Last 3 Encounters:  07/28/24 (!) 351 lb (159.2 kg)  05/27/24 (!) 346 lb (156.9 kg)  05/23/24 (!) 346 lb 3.2 oz (157 kg)      Last CBC Lab Results  Component Value Date   WBC 13.0 (H) 04/21/2024   HGB 15.8 04/21/2024   HCT 47.7 04/21/2024   MCV 82.8 04/21/2024   RDW 13.3 04/21/2024   PLT 215.0 04/21/2024   Last metabolic panel Lab Results  Component Value Date   GLUCOSE 282 (H) 07/28/2024   NA 136 07/28/2024   K 4.4 07/28/2024   CL 98 07/28/2024   CO2 29 07/28/2024   BUN 10 07/28/2024   CREATININE 0.92 07/28/2024   GFR 107.96 07/28/2024   CALCIUM 9.0 07/28/2024   PROT 6.9 07/28/2024   ALBUMIN 4.5 07/28/2024   BILITOT 0.5 07/28/2024   ALKPHOS 70 07/28/2024   AST 21 07/28/2024   ALT 60 (H) 07/28/2024   Last lipids Lab Results  Component Value Date   CHOL 173 04/21/2024   HDL 45.30 04/21/2024   LDLCALC 103 (H) 04/21/2024   TRIG 125.0 04/21/2024   CHOLHDL 4 04/21/2024   Last hemoglobin A1c Lab Results  Component Value Date   HGBA1C 10.3 (H) 07/28/2024   Last thyroid functions Lab Results  Component Value Date   TSH 1.89 04/21/2024   Last vitamin D  Lab Results  Component Value Date   VD25OH 21.61 (L) 07/28/2024   Last vitamin B12 and Folate Lab Results  Component Value Date   VITAMINB12 338 04/21/2024         Assessment & Plan:   Assessment and Plan Assessment & Plan Recurrent right otitis media, right ear  pain Severe infection with possible eustachian tube dysfunction. Diabetes may exacerbate infection risk. - Ordered 1 gram of Rocephin  injection. - Prescribed Augmentin. - Referred to ENT for evaluation and possible tympanostomy tube placement. - Advised to report any recurrence of ear pain before ENT appointment.  Type 2 diabetes mellitus with hyperglycemia, without long term use of insulin, long term use oral hypoglycemic drugs Elevated blood sugars,  fasting levels higher than postprandial. Current metformin  regimen insufficient. Recurrent infections affecting glucose levels. - Increased metformin  to 1000 mg twice daily. - Ordered A1c, liver, kidney function tests, CBC,  and vitamin D  level. - Advised to monitor blood sugars and report significant changes.  Primary hypertension Blood pressure well-controlled with current management and physical activity. - Encouraged continued physical activity.  Vitamin D  deficiency Vitamin D  levels need reassessment post-supplementation. - Ordered vitamin D  level test. - Will discuss potential need for vitamin D  supplementation based on test results.  BMI 45-49 - continue efforts in healthy diet and activity level - discussed effects of weight on diabetes and blood pressures  Medication Management - labs today, will dose adjust medications as indicated      Orders Placed This Encounter  Procedures   Comprehensive metabolic panel with GFR    Release to patient:   Immediate [1]   Hemoglobin A1c   VITAMIN D  25 Hydroxy (Vit-D Deficiency, Fractures)   Ambulatory referral to ENT    Referral Priority:   Routine    Referral Type:   Consultation    Referral Reason:   Specialty Services Required    Referred to Provider:   Pa, Su Dois Moccasin Md    Requested Specialty:   Otolaryngology    Number of Visits Requested:   1     Meds ordered this encounter  Medications   cefTRIAXone  (ROCEPHIN ) injection 1 g   amoxicillin-clavulanate (AUGMENTIN) 875-125 MG tablet    Sig: Take 1 tablet by mouth 2 (two) times daily.    Dispense:  20 tablet    Refill:  0   metFORMIN  (GLUCOPHAGE ) 1000 MG tablet    Sig: Take 1 tablet (1,000 mg total) by mouth 2 (two) times daily with a meal.    Dispense:  180 tablet    Refill:  3    Return in about 3 months (around 10/26/2024) for meds, labs OV.  Corean LITTIE Ku, FNP   "

## 2024-08-01 ENCOUNTER — Ambulatory Visit: Payer: Self-pay | Admitting: Family Medicine

## 2024-08-01 DIAGNOSIS — E559 Vitamin D deficiency, unspecified: Secondary | ICD-10-CM

## 2024-08-01 DIAGNOSIS — E1165 Type 2 diabetes mellitus with hyperglycemia: Secondary | ICD-10-CM

## 2024-08-01 MED ORDER — VITAMIN D (ERGOCALCIFEROL) 1.25 MG (50000 UNIT) PO CAPS: 50000.0000 [IU] | ORAL_CAPSULE | ORAL | 0 refills | Status: AC

## 2024-08-01 MED ORDER — GLIPIZIDE ER 5 MG PO TB24: 5.0000 mg | ORAL_TABLET | Freq: Every day | ORAL | 1 refills | Status: AC

## 2024-08-06 ENCOUNTER — Encounter: Payer: Self-pay | Admitting: Family Medicine

## 2024-08-08 ENCOUNTER — Ambulatory Visit: Admitting: Family Medicine

## 2024-08-08 ENCOUNTER — Encounter: Payer: Self-pay | Admitting: Family Medicine

## 2024-08-08 VITALS — BP 136/88 | HR 72 | Temp 98.6°F | Ht 71.0 in | Wt 351.6 lb

## 2024-08-08 DIAGNOSIS — Z7984 Long term (current) use of oral hypoglycemic drugs: Secondary | ICD-10-CM

## 2024-08-08 DIAGNOSIS — E1165 Type 2 diabetes mellitus with hyperglycemia: Secondary | ICD-10-CM | POA: Diagnosis not present

## 2024-08-08 DIAGNOSIS — H6591 Unspecified nonsuppurative otitis media, right ear: Secondary | ICD-10-CM

## 2024-08-08 NOTE — Progress Notes (Signed)
 "  Acute Office Visit  Subjective:     Patient ID: Brendan Peterson, male    DOB: [birth year], 35 y.o.   MRN: [id number]  No chief complaint on file.   HPI  Discussed the use of AI scribe software for clinical note transcription with the patient, who gave verbal consent to proceed.  History of Present Illness Brendan Peterson is a 34 year old male who presents with persistent ear fluid.  Otologic symptoms - Persistent sensation of ear fluid with some improvement in symptoms - Ongoing ear discomfort associated with fluid retention - Not using nasal sprays - Hydroxyzine used in the past, which helps dry residual fluid  Glycemic variability - Concern for worsening blood sugar fluctuations in recent months - Previously stable blood sugar levels approximately two and a half years ago - Attributes some blood sugar changes to recent illness and ear infection - First became aware of blood sugar issues when his wife had gestational diabetes during her pregnancies     ROS Per HPI      Objective:    BP 136/88 (BP Location: Left Arm, Patient Position: Sitting, Cuff Size: Large)   Pulse 72   Temp 98.6 F (37 C) (Temporal)   Ht 5' 11 (1.803 m)   Wt (!) 351 lb 9.6 oz (159.5 kg)   SpO2 96%   BMI 49.04 kg/m    Physical Exam Vitals and nursing note reviewed.  Constitutional:      General: He is not in acute distress.    Appearance: Normal appearance. He is obese.  HENT:     Head: Normocephalic and atraumatic.     Right Ear: External ear normal. A middle ear effusion is present. Tympanic membrane is not erythematous or bulging.     Left Ear: External ear normal. A middle ear effusion is present. Tympanic membrane is not erythematous or bulging.     Nose: Nose normal.     Mouth/Throat:     Mouth: Mucous membranes are moist.     Pharynx: Oropharynx is clear.  Eyes:     Extraocular Movements: Extraocular movements intact.  Cardiovascular:     Rate and Rhythm: Normal rate and  regular rhythm.  Pulmonary:     Effort: Pulmonary effort is normal.  Musculoskeletal:        General: Normal range of motion.     Cervical back: Normal range of motion.     Right lower leg: No edema.     Left lower leg: No edema.  Lymphadenopathy:     Cervical: No cervical adenopathy.  Skin:    General: Skin is warm and dry.  Neurological:     General: No focal deficit present.     Mental Status: He is alert and oriented to person, place, and time.  Psychiatric:        Mood and Affect: Mood normal.        Behavior: Behavior normal.     No results found for any visits on 08/08/24.      Assessment & Plan:   Assessment and Plan Assessment & Plan Otitis media with effusion, right ear Improvement noted with reduced redness and bulging. Infection resolved, fluid persists. - Initiated Flonase  nasal spray. - Consider zyrtec to try to help dry up the fluid in your ear  Type 2 diabetes mellitus with hyperglycemia, without long term current use of insulin Blood sugar levels worsened, likely due to recent illness. Weight loss noted. Blood sugar control essential to reduce  infection risk. - Continue Metformin  500 mg oral bid. - Encouraged return to gym and regular exercise.     No orders of the defined types were placed in this encounter.    No orders of the defined types were placed in this encounter.   Return if symptoms worsen or fail to improve.  Corean LITTIE Ku, FNP  "

## 2024-08-08 NOTE — Patient Instructions (Signed)
 May try flonase  and zyrtec to help dry out the fluid in your ear.   Follow-up with me for new or worsening symptoms.

## 2024-10-27 ENCOUNTER — Ambulatory Visit: Admitting: Family Medicine
